# Patient Record
Sex: Female | Born: 1940 | Race: White | Hispanic: No | Smoking: Never smoker
Health system: Southern US, Community
[De-identification: ages and names within clinical notes are randomized; demographics above are authoritative.]

## PROBLEM LIST (undated history)

## (undated) DIAGNOSIS — B1920 Unspecified viral hepatitis C without hepatic coma: Secondary | ICD-10-CM

## (undated) DIAGNOSIS — K219 Gastro-esophageal reflux disease without esophagitis: Secondary | ICD-10-CM

## (undated) DIAGNOSIS — E079 Disorder of thyroid, unspecified: Secondary | ICD-10-CM

## (undated) DIAGNOSIS — I219 Acute myocardial infarction, unspecified: Secondary | ICD-10-CM

## (undated) DIAGNOSIS — E785 Hyperlipidemia, unspecified: Secondary | ICD-10-CM

## (undated) DIAGNOSIS — I2510 Atherosclerotic heart disease of native coronary artery without angina pectoris: Secondary | ICD-10-CM

## (undated) DIAGNOSIS — I639 Cerebral infarction, unspecified: Secondary | ICD-10-CM

## (undated) DIAGNOSIS — I10 Essential (primary) hypertension: Secondary | ICD-10-CM

## (undated) HISTORY — PX: TOTAL HIP ARTHROPLASTY: SHX124

## (undated) HISTORY — DX: Unspecified viral hepatitis C without hepatic coma: B19.20

## (undated) HISTORY — PX: JOINT REPLACEMENT: SHX530

## (undated) HISTORY — PX: APPENDECTOMY: SHX54

## (undated) HISTORY — PX: CORONARY STENT PLACEMENT: SHX1402

## (undated) HISTORY — PX: CHOLECYSTECTOMY: SHX55

## (undated) HISTORY — DX: Cerebral infarction, unspecified: I63.9

## (undated) HISTORY — PX: ABDOMINAL HYSTERECTOMY: SHX81

---

## 2009-08-25 ENCOUNTER — Encounter: Admission: RE | Admit: 2009-08-25 | Discharge: 2009-11-23 | Payer: Self-pay | Admitting: *Deleted

## 2009-11-25 ENCOUNTER — Encounter: Admission: RE | Admit: 2009-11-25 | Discharge: 2010-02-22 | Payer: Self-pay | Admitting: *Deleted

## 2010-05-11 ENCOUNTER — Encounter
Admission: RE | Admit: 2010-05-11 | Discharge: 2010-05-25 | Payer: Self-pay | Source: Home / Self Care | Attending: *Deleted | Admitting: *Deleted

## 2011-06-03 ENCOUNTER — Ambulatory Visit: Payer: Self-pay | Admitting: Internal Medicine

## 2011-06-06 ENCOUNTER — Ambulatory Visit: Payer: Self-pay | Admitting: Internal Medicine

## 2011-06-14 ENCOUNTER — Ambulatory Visit: Payer: Self-pay | Admitting: Internal Medicine

## 2011-06-28 ENCOUNTER — Ambulatory Visit: Payer: Self-pay | Admitting: Internal Medicine

## 2012-04-02 ENCOUNTER — Ambulatory Visit: Payer: Medicare Other | Attending: Physician Assistant | Admitting: Physical Therapy

## 2012-04-02 DIAGNOSIS — M545 Low back pain, unspecified: Secondary | ICD-10-CM | POA: Insufficient documentation

## 2012-04-02 DIAGNOSIS — IMO0001 Reserved for inherently not codable concepts without codable children: Secondary | ICD-10-CM | POA: Insufficient documentation

## 2012-04-02 DIAGNOSIS — M2569 Stiffness of other specified joint, not elsewhere classified: Secondary | ICD-10-CM | POA: Insufficient documentation

## 2012-04-03 ENCOUNTER — Ambulatory Visit: Payer: Medicare Other | Admitting: Rehabilitation

## 2012-04-05 ENCOUNTER — Ambulatory Visit: Payer: Medicare Other | Admitting: Rehabilitation

## 2012-04-09 ENCOUNTER — Encounter: Payer: Medicare Other | Admitting: Rehabilitation

## 2012-04-10 ENCOUNTER — Ambulatory Visit: Payer: Medicare Other | Admitting: Rehabilitation

## 2012-04-12 ENCOUNTER — Ambulatory Visit: Payer: Medicare Other | Admitting: Rehabilitation

## 2012-04-16 ENCOUNTER — Ambulatory Visit: Payer: Medicare Other | Admitting: Physical Therapy

## 2012-04-18 ENCOUNTER — Ambulatory Visit: Payer: Medicare Other | Admitting: Rehabilitation

## 2012-04-19 ENCOUNTER — Ambulatory Visit: Payer: Medicare Other | Admitting: Physical Therapy

## 2012-04-20 ENCOUNTER — Ambulatory Visit: Payer: Medicare Other | Admitting: Physical Therapy

## 2012-04-23 ENCOUNTER — Ambulatory Visit: Payer: Medicare Other | Admitting: Physical Therapy

## 2012-04-24 ENCOUNTER — Ambulatory Visit: Payer: Medicare Other | Admitting: Rehabilitation

## 2012-04-25 ENCOUNTER — Ambulatory Visit: Payer: Medicare Other | Admitting: Physical Therapy

## 2012-05-01 ENCOUNTER — Ambulatory Visit: Payer: Medicare Other | Admitting: Physical Therapy

## 2014-01-04 ENCOUNTER — Emergency Department (HOSPITAL_BASED_OUTPATIENT_CLINIC_OR_DEPARTMENT_OTHER): Payer: Medicare Other

## 2014-01-04 ENCOUNTER — Emergency Department (HOSPITAL_BASED_OUTPATIENT_CLINIC_OR_DEPARTMENT_OTHER)
Admission: EM | Admit: 2014-01-04 | Discharge: 2014-01-04 | Payer: Medicare Other | Attending: Emergency Medicine | Admitting: Emergency Medicine

## 2014-01-04 ENCOUNTER — Encounter (HOSPITAL_BASED_OUTPATIENT_CLINIC_OR_DEPARTMENT_OTHER): Payer: Self-pay | Admitting: Emergency Medicine

## 2014-01-04 DIAGNOSIS — R5381 Other malaise: Secondary | ICD-10-CM | POA: Insufficient documentation

## 2014-01-04 DIAGNOSIS — E785 Hyperlipidemia, unspecified: Secondary | ICD-10-CM | POA: Insufficient documentation

## 2014-01-04 DIAGNOSIS — M79602 Pain in left arm: Secondary | ICD-10-CM

## 2014-01-04 DIAGNOSIS — M7989 Other specified soft tissue disorders: Secondary | ICD-10-CM | POA: Insufficient documentation

## 2014-01-04 DIAGNOSIS — R079 Chest pain, unspecified: Secondary | ICD-10-CM

## 2014-01-04 DIAGNOSIS — R0602 Shortness of breath: Secondary | ICD-10-CM | POA: Insufficient documentation

## 2014-01-04 DIAGNOSIS — Z79899 Other long term (current) drug therapy: Secondary | ICD-10-CM | POA: Diagnosis not present

## 2014-01-04 DIAGNOSIS — M549 Dorsalgia, unspecified: Secondary | ICD-10-CM | POA: Diagnosis not present

## 2014-01-04 DIAGNOSIS — M79609 Pain in unspecified limb: Secondary | ICD-10-CM | POA: Diagnosis present

## 2014-01-04 DIAGNOSIS — I252 Old myocardial infarction: Secondary | ICD-10-CM | POA: Diagnosis not present

## 2014-01-04 DIAGNOSIS — R0789 Other chest pain: Secondary | ICD-10-CM | POA: Diagnosis not present

## 2014-01-04 DIAGNOSIS — Z7982 Long term (current) use of aspirin: Secondary | ICD-10-CM | POA: Insufficient documentation

## 2014-01-04 DIAGNOSIS — I10 Essential (primary) hypertension: Secondary | ICD-10-CM | POA: Insufficient documentation

## 2014-01-04 DIAGNOSIS — R5383 Other fatigue: Secondary | ICD-10-CM

## 2014-01-04 DIAGNOSIS — K219 Gastro-esophageal reflux disease without esophagitis: Secondary | ICD-10-CM | POA: Insufficient documentation

## 2014-01-04 HISTORY — DX: Gastro-esophageal reflux disease without esophagitis: K21.9

## 2014-01-04 HISTORY — DX: Essential (primary) hypertension: I10

## 2014-01-04 HISTORY — DX: Acute myocardial infarction, unspecified: I21.9

## 2014-01-04 HISTORY — DX: Hyperlipidemia, unspecified: E78.5

## 2014-01-04 LAB — BASIC METABOLIC PANEL
ANION GAP: 13 (ref 5–15)
BUN: 19 mg/dL (ref 6–23)
CHLORIDE: 95 meq/L — AB (ref 96–112)
CO2: 28 mEq/L (ref 19–32)
Calcium: 10.5 mg/dL (ref 8.4–10.5)
Creatinine, Ser: 0.7 mg/dL (ref 0.50–1.10)
GFR calc non Af Amer: 85 mL/min — ABNORMAL LOW (ref >90)
Glucose, Bld: 93 mg/dL (ref 70–99)
POTASSIUM: 3.6 meq/L — AB (ref 3.7–5.3)
SODIUM: 136 meq/L — AB (ref 137–147)

## 2014-01-04 LAB — CBC
HCT: 41.5 % (ref 36.0–46.0)
Hemoglobin: 14.4 g/dL (ref 12.0–15.0)
MCH: 30.5 pg (ref 26.0–34.0)
MCHC: 34.7 g/dL (ref 30.0–36.0)
MCV: 87.9 fL (ref 78.0–100.0)
PLATELETS: 232 10*3/uL (ref 150–400)
RBC: 4.72 MIL/uL (ref 3.87–5.11)
RDW: 12.3 % (ref 11.5–15.5)
WBC: 7.4 10*3/uL (ref 4.0–10.5)

## 2014-01-04 LAB — TROPONIN I: Troponin I: <0.3 ng/mL (ref <0.30)

## 2014-01-04 MED ORDER — MORPHINE SULFATE 4 MG/ML IJ SOLN
4.0000 mg | Freq: Once | INTRAMUSCULAR | Status: AC
  Administered 2014-01-04: 4 mg via INTRAVENOUS

## 2014-01-04 MED ORDER — ASPIRIN 325 MG PO TABS
325.0000 mg | ORAL_TABLET | Freq: Once | ORAL | Status: AC
  Administered 2014-01-04: 325 mg via ORAL
  Filled 2014-01-04: qty 1

## 2014-01-04 MED ORDER — NITROGLYCERIN 2 % TD OINT
1.0000 [in_us] | TOPICAL_OINTMENT | Freq: Four times a day (QID) | TRANSDERMAL | Status: DC
  Administered 2014-01-04: 1 [in_us] via TOPICAL
  Filled 2014-01-04: qty 1

## 2014-01-04 MED ORDER — NITROGLYCERIN 0.4 MG SL SUBL
0.4000 mg | SUBLINGUAL_TABLET | SUBLINGUAL | Status: AC | PRN
  Administered 2014-01-04 (×3): 0.4 mg via SUBLINGUAL
  Filled 2014-01-04: qty 1

## 2014-01-04 MED ORDER — MORPHINE SULFATE 4 MG/ML IJ SOLN
INTRAMUSCULAR | Status: AC
  Administered 2014-01-04: 4 mg via INTRAVENOUS
  Filled 2014-01-04: qty 1

## 2014-01-04 NOTE — ED Notes (Signed)
Patient states that her BP has been elevated all week. Also states her left arm is hurting, went to the dr on Monday and states that he asked her if she was having any arm pain, but states that she wasn't until Tuesday. Pt has a BP machine at home that she monitors her BP with.

## 2014-01-04 NOTE — ED Provider Notes (Signed)
CSN: [id number]     Arrival date & time 01/04/14  1623 History  This chart was scribed for Toy CookeyMegan Docherty, MD by Elon SpannerGarrett Cook, ED Scribe. This patient was seen in room MH09/MH09 and the patient's care was started at 5:58 PM.    Chief Complaint  Patient presents with  . Arm Pain    Patient is a 73 y.o. female presenting with arm pain. The history is provided by the patient. No language interpreter was used.  Arm Pain This is a new problem. The current episode started more than 2 days ago. The problem occurs daily. The problem has not changed since onset.Associated symptoms include chest pain and shortness of breath. Pertinent negatives include no abdominal pain and no headaches. The symptoms are aggravated by bending and twisting. The symptoms are relieved by heat and rest. She has tried a warm compress and rest for the symptoms.    HPI Comments: Mamie LeversCarolyn Dardis is a 73 y.o. female with a history of MI, stenting, HTN, and hyperlipidemia who presents to the Emergency Department complaining of an acute exacerbation of 6 days of severe intermittent left arm pain with associated back pain.  Patient reports that the left arm pain largely subsided last night before she went to bed but was awoken in the middle of the night by a return of the pain.  She applied a heating pad and noticed significant relief.  She reports that the left arm pain is exacerbated by movement and relieved by rest and heat.  She states that she is currently having associated central chest pain that she describes as tightness, heaviness, and pressure.  Patient reports associated generalized weakness as well as mild SOB.  Patient also states that she experienced nausea several days ago but that it has currently resolved. Patient reports she had some abdominal pain 6 days ago after a large steak meal. Patient states that she has chronic back pain and was seen by her chiropractor 2 days ago.  Patient also reports associated peripheral edema  normal to baseline.  Patient reports that she was seen by her cardiologist Dr. Beverely Paceheek of [facility] 6 days ago but does not recall any acute findings.  She does note, however, that Dr. Beverely Paceheek planned to perform some type of cardiac monitoring within the coming weeks but is unsure of exactly what his plan was.  Patient denies vomiting, diaphoresis, heavy-lifting.   Past Medical History  Diagnosis Date  . Hypertension   . Myocardial infarct   . Hyperlipidemia   . GERD (gastroesophageal reflux disease)    Past Surgical History  Procedure Laterality Date  . Total hip arthroplasty    . Abdominal hysterectomy    . Coronary stent placement     No family history on file. History  Substance Use Topics  . Smoking status: Never Smoker   . Smokeless tobacco: Not on file  . Alcohol Use: No   OB History   Grav Para Term Preterm Abortions TAB SAB Ect Mult Living                 Review of Systems  Constitutional: Negative for fever, chills, diaphoresis, activity change, appetite change and fatigue.  HENT: Negative for congestion, facial swelling, rhinorrhea and sore throat.   Eyes: Negative for photophobia and discharge.  Respiratory: Positive for chest tightness and shortness of breath. Negative for cough.   Cardiovascular: Positive for chest pain and leg swelling. Negative for palpitations.  Gastrointestinal: Negative for nausea, vomiting, abdominal pain and  diarrhea.  Endocrine: Negative for polydipsia and polyuria.  Genitourinary: Negative for dysuria, frequency, difficulty urinating and pelvic pain.  Musculoskeletal: Positive for back pain. Negative for arthralgias, neck pain and neck stiffness.  Skin: Negative for color change and wound.  Allergic/Immunologic: Negative for immunocompromised state.  Neurological: Negative for facial asymmetry, weakness, numbness and headaches.  Hematological: Does not bruise/bleed easily.  Psychiatric/Behavioral: Negative for confusion and  agitation.      Allergies  Betadine; Levaquin; and Zithromax  Home Medications   Prior to Admission medications   Medication Sig Start Date End Date Taking? Authorizing Provider  amLODipine (NORVASC) 10 MG tablet Take 10 mg by mouth daily.   Yes Historical Provider, MD  aspirin 81 MG tablet Take 81 mg by mouth daily.   Yes Historical Provider, MD  atorvastatin (LIPITOR) 10 MG tablet Take 10 mg by mouth daily.   Yes Historical Provider, MD  hydrochlorothiazide (HYDRODIURIL) 25 MG tablet Take 25 mg by mouth daily.   Yes Historical Provider, MD  levothyroxine (SYNTHROID, LEVOTHROID) 75 MCG tablet Take 75 mcg by mouth daily before breakfast.   Yes Historical Provider, MD  losartan (COZAAR) 100 MG tablet Take 100 mg by mouth daily.   Yes Historical Provider, MD  metoprolol tartrate (LOPRESSOR) 25 MG tablet Take 25 mg by mouth 2 (two) times daily.   Yes Historical Provider, MD  omeprazole (PRILOSEC) 40 MG capsule Take 40 mg by mouth daily.   Yes Historical Provider, MD  potassium chloride (K-DUR,KLOR-CON) 10 MEQ tablet Take 10 mEq by mouth 2 (two) times daily.   Yes Historical Provider, MD   BP 124/73  Pulse 61  Resp 20  SpO2 100% Physical Exam  Nursing note and vitals reviewed. Constitutional: She is oriented to person, place, and time. She appears well-developed and well-nourished. No distress.  HENT:  Head: Normocephalic.  Mouth/Throat: Oropharynx is clear and moist.  Eyes: Pupils are equal, round, and reactive to light.  Neck: Neck supple.  Cardiovascular: Normal rate, regular rhythm and normal heart sounds.   Pulmonary/Chest: Effort normal and breath sounds normal. No respiratory distress. She has no wheezes.  Abdominal: Soft. She exhibits no distension. There is no tenderness. There is no rebound and no guarding.  Musculoskeletal: She exhibits tenderness (Tenderness to right arm.  ). She exhibits no edema.  Neurological: She is alert and oriented to person, place, and time.   Skin: Skin is warm and dry.  Psychiatric: She has a normal mood and affect.    ED Course  Procedures (including critical care time)  DIAGNOSTIC STUDIES: Oxygen Saturation is 97% on RA, normal by my interpretation.    COORDINATION OF CARE:  6:06 PM Discussed plans to order NTG, ASA, and NTG.  Discussed need to r/o MI.  Patient acknowledges and agrees with plan.    Labs Review Labs Reviewed  BASIC METABOLIC PANEL - Abnormal; Notable for the following:    Sodium 136 (*)    Potassium 3.6 (*)    Chloride 95 (*)    GFR calc non Af Amer 85 (*)    All other components within normal limits  CBC  TROPONIN I    Imaging Review Dg Chest Portable 1 View  01/04/2014   CLINICAL DATA:  73 year old female with chest pain and shortness of breath  EXAM: PORTABLE CHEST - 1 VIEW  COMPARISON:  10/25/2012 chest radiograph  FINDINGS: Cardiomegaly again identified.  There is no evidence of focal airspace disease, pulmonary edema, suspicious pulmonary nodule/mass, pleural effusion, or pneumothorax.  No acute bony abnormalities are identified.  IMPRESSION: Cardiomegaly without evidence of acute cardiopulmonary disease.   Electronically Signed   By: Laveda Abbe M.D.   On: 01/04/2014 18:37     EKG Interpretation   Date/Time:  Saturday January 04 2014 17:21:59 EDT Ventricular Rate:  72 PR Interval:  176 QRS Duration: 88 QT Interval:  404 QTC Calculation: 442 R Axis:   -14 Text Interpretation:  Normal sinus rhythm Normal ECG no prior for  comparison Confirmed by DOCHERTY  MD, MEGAN ([id number]) on 01/04/2014 5:41:27 PM      MDM   Final diagnoses:  Chest pain, unspecified chest pain type  Left arm pain    Pt is a 73 y.o. female with Pmhx as above who presents with about 1 week of intermittent L arm pain, now also w/ central chest pressure and mild SOB. She had episodic nausea earlier in the week. No ever, cough, LE edema. On PE, pt anxious appearing, but in NAD. +ttp of L arm, but no chest wall ttp. CP  relieved w/ SL NTG, but arm pain persists. EKG w/o acute ischemic changes. Trop not elevated CXR with cardiomegaly. ASA given, NTG paste paste. Spoke w/ Dr. Eden Emms who will accept in transfer to [facility] for ACS r/o.    I personally performed the services described in this documentation, which was scribed in my presence. The recorded information has been reviewed and is accurate.     Toy Cookey, MD 01/04/14 2014

## 2014-01-04 NOTE — ED Notes (Signed)
Pt is giving 2 rings silver in color and purse with all personal belongings placed in it to son Domingo PulseDavid Morris who is health care power of attorney. He can be reached at cell phone [phone] [place].

## 2014-01-20 ENCOUNTER — Ambulatory Visit: Payer: Medicare Other | Attending: Neurology | Admitting: Rehabilitation

## 2014-01-20 DIAGNOSIS — M25559 Pain in unspecified hip: Secondary | ICD-10-CM | POA: Insufficient documentation

## 2014-01-20 DIAGNOSIS — M545 Low back pain, unspecified: Secondary | ICD-10-CM | POA: Diagnosis not present

## 2014-01-20 DIAGNOSIS — IMO0001 Reserved for inherently not codable concepts without codable children: Secondary | ICD-10-CM | POA: Diagnosis not present

## 2014-01-23 ENCOUNTER — Ambulatory Visit: Payer: Medicare Other | Admitting: Rehabilitation

## 2014-01-27 ENCOUNTER — Encounter: Payer: Medicare Other | Admitting: Rehabilitation

## 2014-01-28 ENCOUNTER — Ambulatory Visit: Payer: Medicare Other | Attending: Neurology | Admitting: Rehabilitation

## 2014-01-28 DIAGNOSIS — M25559 Pain in unspecified hip: Secondary | ICD-10-CM | POA: Insufficient documentation

## 2014-01-28 DIAGNOSIS — M545 Low back pain, unspecified: Secondary | ICD-10-CM | POA: Insufficient documentation

## 2014-01-28 DIAGNOSIS — IMO0001 Reserved for inherently not codable concepts without codable children: Secondary | ICD-10-CM | POA: Insufficient documentation

## 2014-01-30 ENCOUNTER — Encounter: Payer: Medicare Other | Admitting: Rehabilitation

## 2014-02-04 ENCOUNTER — Ambulatory Visit: Payer: Medicare Other | Admitting: Physical Therapy

## 2014-02-04 DIAGNOSIS — IMO0001 Reserved for inherently not codable concepts without codable children: Secondary | ICD-10-CM | POA: Diagnosis not present

## 2014-02-06 ENCOUNTER — Ambulatory Visit: Payer: Medicare Other | Admitting: Physical Therapy

## 2014-02-10 ENCOUNTER — Ambulatory Visit: Payer: Medicare Other | Admitting: Physical Therapy

## 2014-02-12 ENCOUNTER — Ambulatory Visit: Payer: Medicare Other | Admitting: Physical Therapy

## 2014-02-12 DIAGNOSIS — IMO0001 Reserved for inherently not codable concepts without codable children: Secondary | ICD-10-CM | POA: Diagnosis not present

## 2014-02-18 ENCOUNTER — Ambulatory Visit: Payer: Medicare Other | Admitting: Physical Therapy

## 2014-02-18 DIAGNOSIS — IMO0001 Reserved for inherently not codable concepts without codable children: Secondary | ICD-10-CM | POA: Diagnosis not present

## 2014-03-04 ENCOUNTER — Ambulatory Visit: Payer: Medicare Other | Admitting: Physical Therapy

## 2014-03-10 ENCOUNTER — Ambulatory Visit: Payer: Medicare Other | Admitting: Physical Therapy

## 2014-03-11 ENCOUNTER — Encounter: Payer: Medicare Other | Admitting: Physical Therapy

## 2014-03-13 ENCOUNTER — Ambulatory Visit: Payer: Medicare Other | Admitting: Physical Therapy

## 2014-03-17 ENCOUNTER — Ambulatory Visit: Payer: Medicare Other | Attending: Neurology

## 2014-03-17 DIAGNOSIS — M25552 Pain in left hip: Secondary | ICD-10-CM | POA: Diagnosis not present

## 2014-03-17 DIAGNOSIS — M545 Low back pain: Secondary | ICD-10-CM | POA: Insufficient documentation

## 2014-03-17 DIAGNOSIS — Z5189 Encounter for other specified aftercare: Secondary | ICD-10-CM | POA: Diagnosis not present

## 2014-03-19 ENCOUNTER — Ambulatory Visit: Payer: Medicare Other

## 2014-03-24 ENCOUNTER — Ambulatory Visit: Payer: Medicare Other | Admitting: Physical Therapy

## 2014-03-26 ENCOUNTER — Ambulatory Visit: Payer: Medicare Other

## 2014-03-31 ENCOUNTER — Ambulatory Visit: Payer: Medicare Other | Attending: Neurology

## 2014-03-31 DIAGNOSIS — Z5189 Encounter for other specified aftercare: Secondary | ICD-10-CM | POA: Diagnosis not present

## 2014-03-31 DIAGNOSIS — M545 Low back pain: Secondary | ICD-10-CM | POA: Diagnosis not present

## 2014-03-31 DIAGNOSIS — M25552 Pain in left hip: Secondary | ICD-10-CM | POA: Diagnosis not present

## 2014-04-03 ENCOUNTER — Ambulatory Visit: Payer: Medicare Other

## 2014-04-03 DIAGNOSIS — Z5189 Encounter for other specified aftercare: Secondary | ICD-10-CM | POA: Diagnosis not present

## 2014-04-07 ENCOUNTER — Ambulatory Visit: Payer: Medicare Other

## 2014-04-07 DIAGNOSIS — Z5189 Encounter for other specified aftercare: Secondary | ICD-10-CM | POA: Diagnosis not present

## 2014-04-10 ENCOUNTER — Ambulatory Visit: Payer: Medicare Other

## 2014-04-10 DIAGNOSIS — Z5189 Encounter for other specified aftercare: Secondary | ICD-10-CM | POA: Diagnosis not present

## 2014-04-14 ENCOUNTER — Ambulatory Visit: Payer: Medicare Other

## 2014-04-14 DIAGNOSIS — Z5189 Encounter for other specified aftercare: Secondary | ICD-10-CM | POA: Diagnosis not present

## 2014-04-17 ENCOUNTER — Ambulatory Visit: Payer: Medicare Other | Admitting: Rehabilitation

## 2014-04-17 DIAGNOSIS — Z5189 Encounter for other specified aftercare: Secondary | ICD-10-CM | POA: Diagnosis not present

## 2014-04-21 ENCOUNTER — Ambulatory Visit: Payer: Medicare Other | Admitting: Rehabilitation

## 2014-04-21 DIAGNOSIS — Z5189 Encounter for other specified aftercare: Secondary | ICD-10-CM | POA: Diagnosis not present

## 2014-04-30 ENCOUNTER — Encounter: Payer: Medicare Other | Admitting: Rehabilitation

## 2014-05-07 ENCOUNTER — Encounter: Payer: Medicare Other | Admitting: Rehabilitation

## 2014-05-08 ENCOUNTER — Ambulatory Visit: Payer: Medicare Other | Attending: Neurology | Admitting: Rehabilitation

## 2014-05-08 DIAGNOSIS — M545 Low back pain: Secondary | ICD-10-CM | POA: Diagnosis not present

## 2014-05-08 DIAGNOSIS — Z5189 Encounter for other specified aftercare: Secondary | ICD-10-CM | POA: Insufficient documentation

## 2014-05-08 DIAGNOSIS — M25552 Pain in left hip: Secondary | ICD-10-CM | POA: Diagnosis not present

## 2014-05-19 ENCOUNTER — Ambulatory Visit: Payer: Medicare Other | Admitting: Rehabilitation

## 2014-05-21 ENCOUNTER — Ambulatory Visit: Payer: Medicare Other | Admitting: Rehabilitation

## 2014-05-27 ENCOUNTER — Ambulatory Visit: Payer: Medicare Other | Admitting: Rehabilitation

## 2014-05-29 ENCOUNTER — Ambulatory Visit: Payer: Medicare Other | Admitting: Rehabilitation

## 2014-06-04 ENCOUNTER — Ambulatory Visit: Payer: Medicare Other | Attending: Neurology | Admitting: Rehabilitation

## 2014-06-04 DIAGNOSIS — M545 Low back pain: Secondary | ICD-10-CM | POA: Insufficient documentation

## 2014-06-04 DIAGNOSIS — Z5189 Encounter for other specified aftercare: Secondary | ICD-10-CM | POA: Insufficient documentation

## 2014-06-04 DIAGNOSIS — M25552 Pain in left hip: Secondary | ICD-10-CM | POA: Insufficient documentation

## 2014-06-11 ENCOUNTER — Ambulatory Visit: Payer: Medicare Other | Admitting: Rehabilitation

## 2016-08-01 IMAGING — CR DG CHEST 1V PORT
1 series · 1 of 1 positions shown · non-contrast
Comparison: 10/25/2012 chest radiograph

CLINICAL DATA: 72-year-old female with chest pain and shortness of
breath

EXAM:
PORTABLE CHEST - 1 VIEW

[view not recorded]
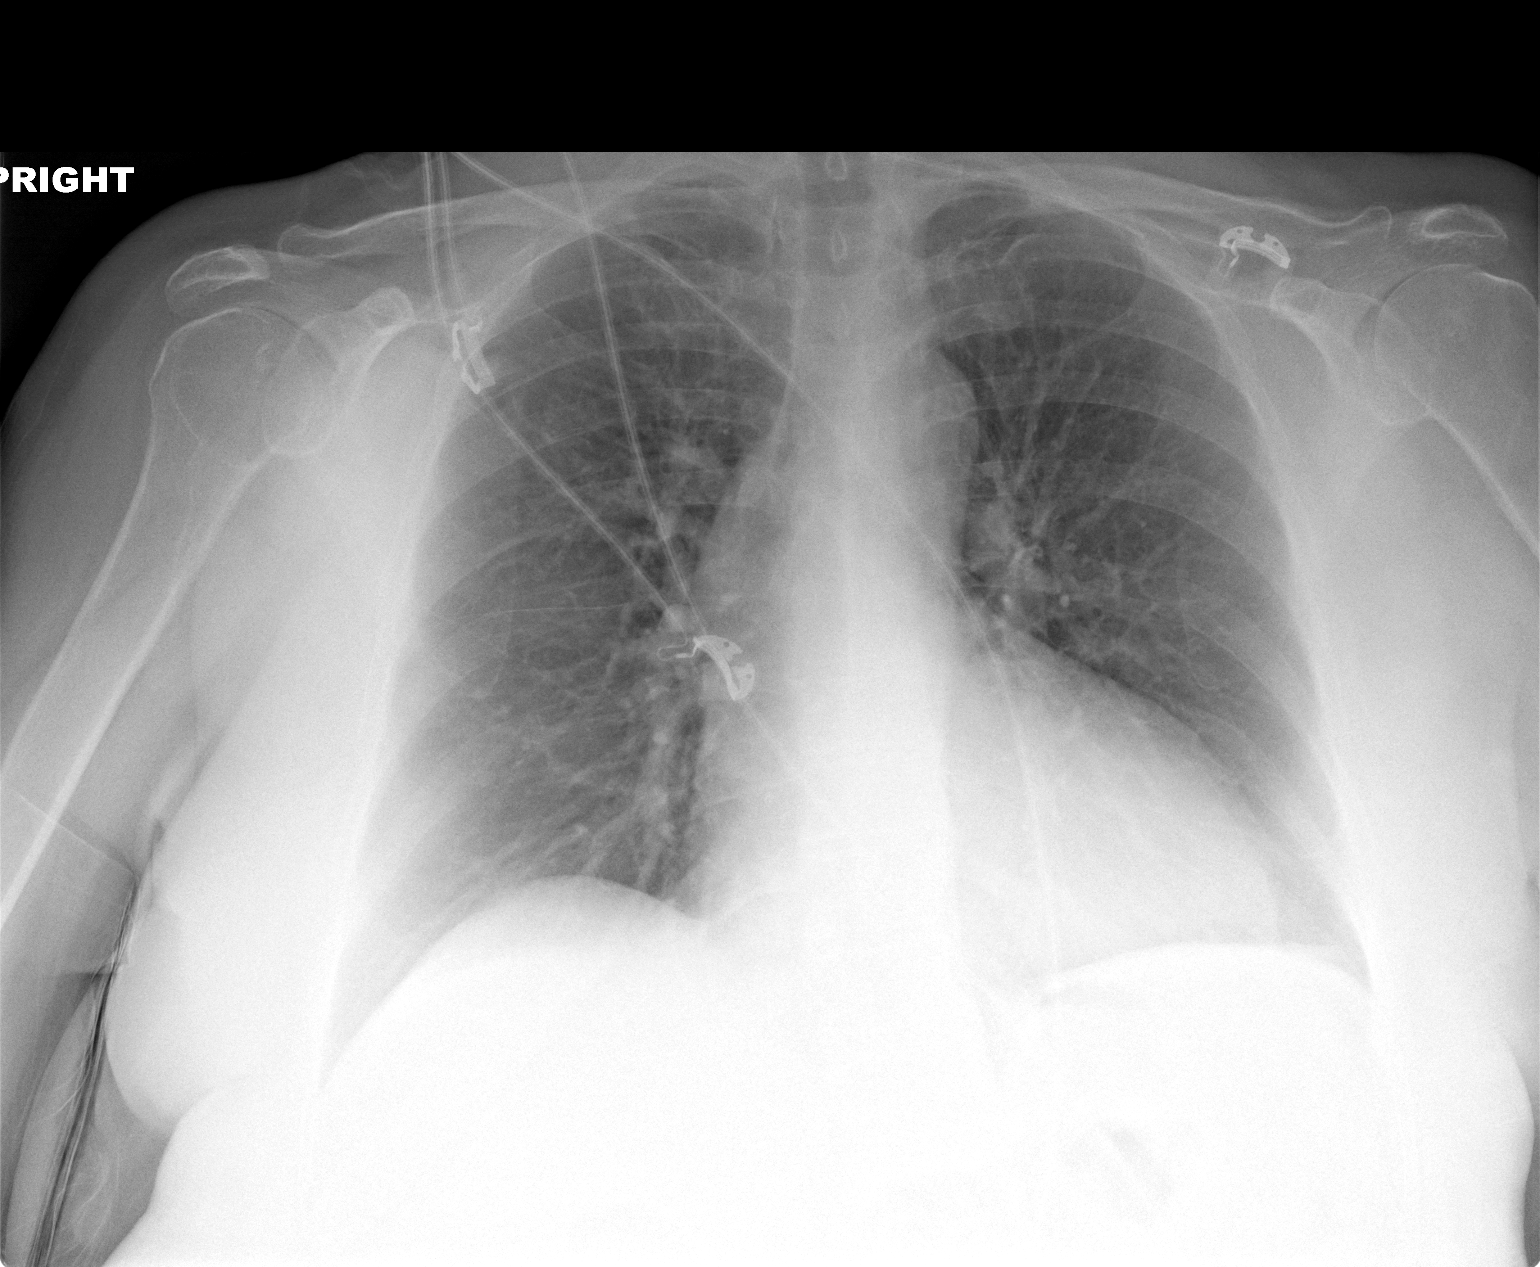

[1 of 1 positions shown; findings below may reference images not displayed]

FINDINGS: Cardiomegaly again identified.

There is no evidence of focal airspace disease, pulmonary edema,
suspicious pulmonary nodule/mass, pleural effusion, or pneumothorax.
No acute bony abnormalities are identified.
IMPRESSION: Cardiomegaly without evidence of acute cardiopulmonary disease.

## 2016-12-25 ENCOUNTER — Emergency Department (HOSPITAL_BASED_OUTPATIENT_CLINIC_OR_DEPARTMENT_OTHER)
Admission: EM | Admit: 2016-12-25 | Discharge: 2016-12-25 | Disposition: A | Discharge status: Home / Self Care | Payer: Medicare Other | Attending: Emergency Medicine | Admitting: Emergency Medicine

## 2016-12-25 ENCOUNTER — Encounter (HOSPITAL_BASED_OUTPATIENT_CLINIC_OR_DEPARTMENT_OTHER): Payer: Self-pay | Admitting: *Deleted

## 2016-12-25 DIAGNOSIS — I83812 Varicose veins of left lower extremities with pain: Secondary | ICD-10-CM | POA: Diagnosis present

## 2016-12-25 DIAGNOSIS — I2510 Atherosclerotic heart disease of native coronary artery without angina pectoris: Secondary | ICD-10-CM | POA: Insufficient documentation

## 2016-12-25 DIAGNOSIS — Z7982 Long term (current) use of aspirin: Secondary | ICD-10-CM | POA: Insufficient documentation

## 2016-12-25 DIAGNOSIS — E079 Disorder of thyroid, unspecified: Secondary | ICD-10-CM | POA: Insufficient documentation

## 2016-12-25 DIAGNOSIS — I83892 Varicose veins of left lower extremities with other complications: Secondary | ICD-10-CM | POA: Insufficient documentation

## 2016-12-25 DIAGNOSIS — I252 Old myocardial infarction: Secondary | ICD-10-CM | POA: Insufficient documentation

## 2016-12-25 DIAGNOSIS — Z79899 Other long term (current) drug therapy: Secondary | ICD-10-CM | POA: Insufficient documentation

## 2016-12-25 DIAGNOSIS — I10 Essential (primary) hypertension: Secondary | ICD-10-CM | POA: Diagnosis not present

## 2016-12-25 HISTORY — DX: Disorder of thyroid, unspecified: E07.9

## 2016-12-25 HISTORY — DX: Atherosclerotic heart disease of native coronary artery without angina pectoris: I25.10

## 2016-12-25 NOTE — ED Notes (Signed)
No bleeding noted from leg at d/c. Dressing intact.

## 2016-12-25 NOTE — ED Triage Notes (Signed)
Patient states she hit her left lower leg last night and burst a varicose vein.  States there was a lot of bleeding last night, and after being wrapped all day, it continues to bleed.

## 2016-12-25 NOTE — ED Provider Notes (Signed)
[facility] Provider Note   CSN: [id number] Arrival date & time: 12/25/16  1833  By signing my name below, I, Ny'Kea Lewis, attest that this documentation has been prepared under the direction and in the presence of Linwood DibblesKnapp, Brenya Taulbee, MD. Electronically Signed: Karren CobbleNy'Kea Lewis, ED Scribe. 12/25/16. 7:49 PM.  History   Chief Complaint Chief Complaint  Patient presents with  . Leg Injury    vein bleeding, left    The history is provided by the patient. No language interpreter was used.    HPI  HPI Comments: Samantha LeversCarolyn Randolph is a 76 y.o. female with a PMHx of CAD, HLD, HTN who presents to the Emergency Department complaining of left lower extremity hemorrhage that began last night. She notes associated pain to the extremity as well. She reports last night she bumped her leg and bursted a vein that persistently bled until she bandaged it applied pressutre to the area. Today she reports  After removing the bandage today it continued to "pour blood" and  With rebandaging and applying pressure the bleeding stopped. Bleeding is controlled at this time. Denies numbness or weakness of the extremity.    Past Medical History:  Diagnosis Date  . Coronary artery disease    2009 & 2016  . GERD (gastroesophageal reflux disease)   . Hyperlipidemia   . Hypertension   . Myocardial infarct (HCC)   . Thyroid disease    There are no active problems to display for this patient.  Past Surgical History:  Procedure Laterality Date  . ABDOMINAL HYSTERECTOMY    . APPENDECTOMY    . CHOLECYSTECTOMY    . CORONARY STENT PLACEMENT    . JOINT REPLACEMENT Bilateral   . TOTAL HIP ARTHROPLASTY     OB History    No data available     Home Medications    Prior to Admission medications   Medication Sig Start Date End Date Taking? Authorizing Provider  amLODipine (NORVASC) 10 MG tablet Take 10 mg by mouth daily.   Yes [provider]  aspirin 81 MG tablet Take 81 mg by mouth daily.   Yes  [provider]  B Complex-C-Biotin-D-Zinc-FA (VITAL-D RX PO) Take by mouth.   Yes [provider]  hydrochlorothiazide (HYDRODIURIL) 25 MG tablet Take 25 mg by mouth daily.   Yes [provider]  levothyroxine (SYNTHROID, LEVOTHROID) 75 MCG tablet Take 75 mcg by mouth daily before breakfast.   Yes [provider]  loratadine (CLARITIN) 10 MG tablet Take 10 mg by mouth daily.   Yes [provider]  losartan (COZAAR) 100 MG tablet Take 100 mg by mouth daily.   Yes [provider]  metoprolol tartrate (LOPRESSOR) 25 MG tablet Take 50 mg by mouth 1 day or 1 dose.    Yes [provider]  nitroGLYCERIN (NITROSTAT) 0.4 MG SL tablet Place 0.4 mg under the tongue every 5 (five) minutes as needed for chest pain.   Yes [provider]  Omega-3 Fatty Acids (FISH OIL PO) Take by mouth.   Yes [provider]  omeprazole (PRILOSEC) 40 MG capsule Take 40 mg by mouth daily.   Yes [provider]  potassium chloride (K-DUR,KLOR-CON) 10 MEQ tablet Take 100 mEq by mouth 2 (two) times daily.    Yes [provider]  atorvastatin (LIPITOR) 10 MG tablet Take 10 mg by mouth daily.    [provider]    Family History No family history on file.  Social History Social History  Substance Use Topics  . Smoking status: Never Smoker  . Smokeless tobacco: Never Used  . Alcohol use No   Allergies   Betadine [povidone iodine]; Levaquin [levofloxacin]; Other; Zithromax [azithromycin]; and Antacid medicine [traumeel]  Review of Systems Review of Systems  Skin:       Small opening to the left lower extremity shin area.  Neurological: Negative for weakness and numbness.  Hematological: Bruises/bleeds easily.    Physical Exam Updated Vital Signs BP (!) 151/76 (BP Location: Left Arm)   Pulse 70   Temp 98.3 F (36.8 C) (Oral)   Resp 20   Ht 1.626 m (5\' 4" )   Wt 77.1 kg (170 lb)   SpO2 100%   BMI 29.18  kg/m   Physical Exam  Constitutional: She appears well-developed and well-nourished. No distress.  HENT:  Head: Normocephalic and atraumatic.  Right Ear: External ear normal.  Left Ear: External ear normal.  Eyes: Conjunctivae are normal. Right eye exhibits no discharge. Left eye exhibits no discharge. No scleral icterus.  Neck: Neck supple. No tracheal deviation present.  Cardiovascular: Normal rate.   Pulmonary/Chest: Effort normal. No stridor. No respiratory distress.  Abdominal: She exhibits no distension.  Musculoskeletal: She exhibits no edema.  Small pinpoint area oozing blood from varicose veins. Bleeding is controlled with applied pressure.   Neurological: She is alert. Cranial nerve deficit: no gross deficits.  Skin: Skin is warm and dry. No rash noted.  Psychiatric: She has a normal mood and affect.  Nursing note and vitals reviewed.   ED Treatments / Results  DIAGNOSTIC STUDIES: Oxygen Saturation is 100% on RA, normal by my interpretation.   COORDINATION OF CARE: 7:44 PM-Discussed next steps with pt. Pt verbalized understanding and is agreeable with the plan.   Labs (all labs ordered are listed, but only abnormal results are displayed) Labs Reviewed - No data to display    Procedures Procedures (including critical care time) Quick clot sterile dressing applied to the wound.  Pressure dressing applied.  Hemostasis achieved   Medications Ordered in ED Medications - No data to display   Initial Impression / Assessment and Plan / ED Course  I have reviewed the triage vital signs and the nursing notes.  Pertinent labs & imaging results that were available during my care of the patient were reviewed by me and considered in my medical decision making (see chart for details).   Pt presents with bleeding varicose vein.  Patient was able to control the bleeding with a pressure dressing that she applied at home. When I removed the bandage to examine the wound  bleeding restarted. I applied a quick clot dressing.  Patient was observed in the emergency room. 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  The recorded information has been reviewed and is accurate.    Linwood DibblesKnapp, Sotirios Navarro, MD 12/25/16 2017

## 2016-12-25 NOTE — Discharge Instructions (Signed)
Consider seeing a vascular and vein specialist if the sx persist, return as needed for worsening symptoms

## 2016-12-25 NOTE — ED Notes (Signed)
Pt up to bathroom. No bleed through noted on dressing.

## 2016-12-26 ENCOUNTER — Telehealth: Payer: Self-pay | Admitting: *Deleted

## 2016-12-26 NOTE — Telephone Encounter (Signed)
Responding to Ms. Conery's telephone voice message regarding LE varicosity bleeding episode with trip to ER yesterday (12-25-2016). Was unable to speak to Ms. Crysler directly. Left telephone voice message that [facility] would call her tomorrow (12-27-2016) to set up an appointment.

## 2016-12-28 ENCOUNTER — Other Ambulatory Visit: Payer: Self-pay

## 2016-12-28 DIAGNOSIS — I83813 Varicose veins of bilateral lower extremities with pain: Secondary | ICD-10-CM

## 2016-12-30 ENCOUNTER — Encounter: Payer: Self-pay | Admitting: Vascular Surgery

## 2017-01-02 ENCOUNTER — Encounter: Payer: Self-pay | Admitting: Vascular Surgery

## 2017-01-02 ENCOUNTER — Ambulatory Visit (INDEPENDENT_AMBULATORY_CARE_PROVIDER_SITE_OTHER): Payer: Medicare Other | Admitting: Vascular Surgery

## 2017-01-02 VITALS — BP 146/88 | HR 87 | Temp 98.3°F | Resp 18 | Ht 64.0 in | Wt 170.0 lb

## 2017-01-02 DIAGNOSIS — I8393 Asymptomatic varicose veins of bilateral lower extremities: Secondary | ICD-10-CM | POA: Insufficient documentation

## 2017-01-02 NOTE — Progress Notes (Signed)
Subjective:     Patient ID: Samantha LeversCarolyn Loyal, female   DOB: 26-Oct-1940, 76 y.o.   MRN: [id number]  HPI This 76 year old female was referred by Dr.Knapp from the emergency department because of a bleeding varicose vein 6 days ago. The patient states that she fell a few weeks ago and landed on her shin area bilaterally but had no bleeding. About 5 days later she had an episode of spontaneous bleeding from a very small spider veins in the left pretibial region. She went to the emergency department and this was controlled with a thrombotic agent and dressing and she returns today for further evaluation. She has no history of DVT thrombophlebitis stasis ulcers or previous bleeding. She developed heaviness in both legs as the day progresses. She does not elastic compression stockings.  Past Medical History:  Diagnosis Date  . Coronary artery disease    2009 & 2016  . GERD (gastroesophageal reflux disease)   . Hepatitis C    hx of  . Hyperlipidemia   . Hypertension   . Myocardial infarct (HCC)   . Stroke (HCC)   . Thyroid disease     Social History  Substance Use Topics  . Smoking status: Never Smoker  . Smokeless tobacco: Never Used  . Alcohol use No    History reviewed. No pertinent family history.  Allergies  Allergen Reactions  . Candesartan Rash    photosensitivity  . Betadine [Povidone Iodine]   . Levaquin [Levofloxacin]   . Other     Blood pressure medication causes skin burns In the sun.  . Valsartan     Other reaction(s): RASH  . Zithromax [Azithromycin]   . Antacid Medicine [Traumeel] Rash  . Povidone-Iodine Rash     Current Outpatient Prescriptions:  .  amLODipine (NORVASC) 10 MG tablet, Take 10 mg by mouth daily., Disp: , Rfl:  .  aspirin 81 MG tablet, Take 81 mg by mouth daily., Disp: , Rfl:  .  atorvastatin (LIPITOR) 10 MG tablet, Take 10 mg by mouth daily., Disp: , Rfl:  .  B Complex-C-Biotin-D-Zinc-FA (VITAL-D RX PO), Take by mouth., Disp: , Rfl:  .   hydrochlorothiazide (HYDRODIURIL) 25 MG tablet, Take 25 mg by mouth daily., Disp: , Rfl:  .  levothyroxine (SYNTHROID, LEVOTHROID) 75 MCG tablet, Take 75 mcg by mouth daily before breakfast., Disp: , Rfl:  .  loratadine (CLARITIN) 10 MG tablet, Take 10 mg by mouth daily., Disp: , Rfl:  .  losartan (COZAAR) 100 MG tablet, Take 100 mg by mouth daily., Disp: , Rfl:  .  metoprolol tartrate (LOPRESSOR) 25 MG tablet, Take 50 mg by mouth 1 day or 1 dose. , Disp: , Rfl:  .  nitroGLYCERIN (NITROSTAT) 0.4 MG SL tablet, Place 0.4 mg under the tongue every 5 (five) minutes as needed for chest pain., Disp: , Rfl:  .  Omega-3 Fatty Acids (FISH OIL PO), Take by mouth., Disp: , Rfl:  .  omeprazole (PRILOSEC) 40 MG capsule, Take 40 mg by mouth daily., Disp: , Rfl:  .  potassium chloride (K-DUR,KLOR-CON) 10 MEQ tablet, Take 100 mEq by mouth 2 (two) times daily. , Disp: , Rfl:   Vitals:   01/02/17 1505  BP: (!) 146/88  Pulse: 87  Resp: 18  Temp: 98.3 F (36.8 C)  TempSrc: Oral  SpO2: 96%  Weight: 170 lb (77.1 kg)  Height: 5\' 4"  (1.626 m)    Body mass index is 29.18 kg/m.        Review of  Systems Denies chest pain. Does have a history of myocardial infarction and 2 previous occasions. Also had a stroke several years ago with some loss of vision in the left eye which resolved after 2 weeks. See history of present illness.    Objective:   Physical Exam BP (!) 146/88 (BP Location: Left Arm, Patient Position: Sitting, Cuff Size: Normal)   Pulse 87   Temp 98.3 F (36.8 C) (Oral)   Resp 18   Ht 5\' 4"  (1.626 m)   Wt 170 lb (77.1 kg)   SpO2 96%   BMI 29.18 kg/m     Gen.-alert and oriented x3 in no apparent distress HEENT normal for age Lungs no rhonchi or wheezing Cardiovascular regular rhythm no murmurs carotid pulses 3+ palpable no bruits audible Abdomen soft nontender no palpable masses Musculoskeletal free of  major deformities Skin clear -no rashes Neurologic normal Lower  extremities 3+ femoral and dorsalis pedis pulses palpable bilaterally with no edema Diffuse spider veins bilaterally particularly in the medial lateral and posterior distal thigh area and medial and lateral calf. Area of bleeding in the left pretibial region was examined and there is no ulceration overlying this and no evidence of any recent active bleeding. No ulcerations noted distally. A few small varicosities in the right lateral thigh. No hyperpigmentation.  Today I performed a bedside sono site ultrasound exam in both great saphenous veins appear normal in size with no evidence of reflux       Assessment:     #1 one episode of spontaneous bleeding from left pretibial reticular vein which was traumatized 1 week earlier-no evidence of great saphenous vein reflux bilaterally #2 coronary artery disease-status post 2 previous myocardial infarctions #3 cerebrovascular disease-episode of possible stroke several years ago with temporary loss of vision left eye #4 lumbar spine disease    Plan:     There is no indication for any intervention at this time   if patient develops another spontaneous bleeding episode then this will be treated with foam sclerotherapy. She was informed regarding elevation of the legs if she does have venous bleeding and compression for 30 minutes Would not recommend foam sclerotherapy for her extensive spider veins and reticular veins Return on a when necessary basis

## 2017-02-10 ENCOUNTER — Encounter: Payer: Medicare Other | Admitting: Vascular Surgery

## 2017-02-10 ENCOUNTER — Encounter (HOSPITAL_COMMUNITY): Payer: Medicare Other
